# Patient Record
Sex: Female | Born: 1970 | Race: White | Hispanic: No | Marital: Married | Smoking: Former smoker
Health system: Southern US, Community
[De-identification: ages and names within clinical notes are randomized; demographics above are authoritative.]

## PROBLEM LIST (undated history)

## (undated) DIAGNOSIS — F419 Anxiety disorder, unspecified: Secondary | ICD-10-CM

## (undated) DIAGNOSIS — D649 Anemia, unspecified: Secondary | ICD-10-CM

---

## 2000-01-26 ENCOUNTER — Emergency Department (HOSPITAL_COMMUNITY): Admission: EM | Admit: 2000-01-26 | Discharge: 2000-01-26 | Payer: Self-pay | Admitting: Emergency Medicine

## 2000-02-07 ENCOUNTER — Emergency Department (HOSPITAL_COMMUNITY): Admission: EM | Admit: 2000-02-07 | Discharge: 2000-02-07 | Payer: Self-pay

## 2000-02-23 ENCOUNTER — Emergency Department (HOSPITAL_COMMUNITY): Admission: EM | Admit: 2000-02-23 | Discharge: 2000-02-23 | Payer: Self-pay | Admitting: *Deleted

## 2007-01-07 ENCOUNTER — Other Ambulatory Visit: Admission: RE | Admit: 2007-01-07 | Discharge: 2007-01-07 | Payer: Self-pay | Admitting: Obstetrics and Gynecology

## 2007-01-08 ENCOUNTER — Ambulatory Visit (HOSPITAL_COMMUNITY): Admission: RE | Admit: 2007-01-08 | Discharge: 2007-01-08 | Payer: Self-pay | Admitting: Obstetrics and Gynecology

## 2007-01-29 ENCOUNTER — Ambulatory Visit (HOSPITAL_COMMUNITY): Admission: RE | Admit: 2007-01-29 | Discharge: 2007-01-29 | Payer: Self-pay | Admitting: Obstetrics and Gynecology

## 2007-07-22 ENCOUNTER — Inpatient Hospital Stay (HOSPITAL_COMMUNITY): Admission: AD | Admit: 2007-07-22 | Discharge: 2007-07-22 | Payer: Self-pay | Admitting: Obstetrics & Gynecology

## 2007-08-13 ENCOUNTER — Inpatient Hospital Stay (HOSPITAL_COMMUNITY): Admission: AD | Admit: 2007-08-13 | Discharge: 2007-08-15 | Payer: Self-pay | Admitting: Obstetrics and Gynecology

## 2007-08-23 ENCOUNTER — Inpatient Hospital Stay (HOSPITAL_COMMUNITY): Admission: AD | Admit: 2007-08-23 | Discharge: 2007-08-23 | Payer: Self-pay | Admitting: Obstetrics and Gynecology

## 2007-09-24 ENCOUNTER — Inpatient Hospital Stay (HOSPITAL_COMMUNITY): Admission: AD | Admit: 2007-09-24 | Discharge: 2007-09-24 | Payer: Self-pay | Admitting: Obstetrics and Gynecology

## 2008-07-07 ENCOUNTER — Observation Stay: Payer: Self-pay | Admitting: Obstetrics and Gynecology

## 2008-09-29 ENCOUNTER — Inpatient Hospital Stay (HOSPITAL_COMMUNITY): Admission: AD | Admit: 2008-09-29 | Discharge: 2008-09-29 | Payer: Self-pay | Admitting: Obstetrics and Gynecology

## 2008-10-11 ENCOUNTER — Inpatient Hospital Stay (HOSPITAL_COMMUNITY): Admission: AD | Admit: 2008-10-11 | Discharge: 2008-10-14 | Payer: Self-pay | Admitting: Obstetrics & Gynecology

## 2010-06-25 ENCOUNTER — Encounter: Payer: Self-pay | Admitting: Obstetrics and Gynecology

## 2010-09-12 LAB — CBC
HCT: 30.8 % — ABNORMAL LOW (ref 36.0–46.0)
HCT: 32.2 % — ABNORMAL LOW (ref 36.0–46.0)
Hemoglobin: 10.2 g/dL — ABNORMAL LOW (ref 12.0–15.0)
MCHC: 32.8 g/dL (ref 30.0–36.0)
MCHC: 33.2 g/dL (ref 30.0–36.0)
MCV: 83 fL (ref 78.0–100.0)
MCV: 85.1 fL (ref 78.0–100.0)
Platelets: 159 10*3/uL (ref 150–400)
RDW: 16.8 % — ABNORMAL HIGH (ref 11.5–15.5)
RDW: 16.9 % — ABNORMAL HIGH (ref 11.5–15.5)

## 2010-12-06 ENCOUNTER — Emergency Department: Payer: Self-pay | Admitting: Internal Medicine

## 2011-02-26 LAB — DIFFERENTIAL
Basophils Absolute: 0
Eosinophils Absolute: 0.2
Lymphocytes Relative: 15
Lymphs Abs: 1.3
Neutrophils Relative %: 76

## 2011-02-26 LAB — CBC
MCHC: 33.5
MCV: 81
Platelets: 210
Platelets: 240
Platelets: 246
RDW: 16.3 — ABNORMAL HIGH
RDW: 16.7 — ABNORMAL HIGH
RDW: 16.6 — ABNORMAL HIGH
WBC: 11.6 — ABNORMAL HIGH
WBC: 8.4

## 2011-02-26 LAB — URINE MICROSCOPIC-ADD ON: RBC / HPF: NONE SEEN

## 2011-02-26 LAB — URINALYSIS, ROUTINE W REFLEX MICROSCOPIC
Leukocytes, UA: NEGATIVE
Nitrite: NEGATIVE
Specific Gravity, Urine: <1.005 — ABNORMAL LOW
Urobilinogen, UA: 0.2
pH: 5.5

## 2011-02-26 LAB — RPR: RPR Ser Ql: NONREACTIVE

## 2016-10-23 ENCOUNTER — Emergency Department
Admission: EM | Admit: 2016-10-23 | Discharge: 2016-10-23 | Disposition: A | Discharge status: Home / Self Care | Payer: BLUE CROSS/BLUE SHIELD | Attending: Emergency Medicine | Admitting: Emergency Medicine

## 2016-10-23 ENCOUNTER — Encounter: Payer: Self-pay | Admitting: Emergency Medicine

## 2016-10-23 DIAGNOSIS — Z87891 Personal history of nicotine dependence: Secondary | ICD-10-CM | POA: Insufficient documentation

## 2016-10-23 DIAGNOSIS — R002 Palpitations: Secondary | ICD-10-CM | POA: Diagnosis present

## 2016-10-23 DIAGNOSIS — D509 Iron deficiency anemia, unspecified: Secondary | ICD-10-CM | POA: Diagnosis not present

## 2016-10-23 LAB — CBC
HCT: 32.3 % — ABNORMAL LOW (ref 35.0–47.0)
HEMOGLOBIN: 10.2 g/dL — AB (ref 12.0–16.0)
MCH: 22.9 pg — ABNORMAL LOW (ref 26.0–34.0)
MCHC: 31.7 g/dL — AB (ref 32.0–36.0)
MCV: 72.1 fL — AB (ref 80.0–100.0)
PLATELETS: 308 10*3/uL (ref 150–440)
RBC: 4.48 MIL/uL (ref 3.80–5.20)
RDW: 17.8 % — ABNORMAL HIGH (ref 11.5–14.5)
WBC: 8.5 10*3/uL (ref 3.6–11.0)

## 2016-10-23 LAB — COMPREHENSIVE METABOLIC PANEL
ALK PHOS: 42 U/L (ref 38–126)
ALT: 9 U/L — AB (ref 14–54)
AST: 23 U/L (ref 15–41)
Albumin: 4.2 g/dL (ref 3.5–5.0)
Anion gap: 9 (ref 5–15)
BUN: 10 mg/dL (ref 6–20)
CHLORIDE: 103 mmol/L (ref 101–111)
CO2: 27 mmol/L (ref 22–32)
Calcium: 9.6 mg/dL (ref 8.9–10.3)
Creatinine, Ser: 0.71 mg/dL (ref 0.44–1.00)
GFR calc Af Amer: >60 mL/min (ref >60)
GFR calc non Af Amer: >60 mL/min (ref >60)
GLUCOSE: 109 mg/dL — AB (ref 65–99)
POTASSIUM: 3.3 mmol/L — AB (ref 3.5–5.1)
Sodium: 139 mmol/L (ref 135–145)
Total Bilirubin: 0.6 mg/dL (ref 0.3–1.2)
Total Protein: 7.8 g/dL (ref 6.5–8.1)

## 2016-10-23 LAB — T4, FREE: Free T4: 0.9 ng/dL (ref 0.61–1.12)

## 2016-10-23 LAB — TSH: TSH: 4.09 u[IU]/mL (ref 0.350–4.500)

## 2016-10-23 LAB — TROPONIN I: Troponin I: <0.03 ng/mL (ref <0.03)

## 2016-10-23 MED ORDER — FERROUS SULFATE DRIED ER 160 (50 FE) MG PO TBCR: 160.0000 mg | EXTENDED_RELEASE_TABLET | Freq: Every day | ORAL | 6 refills | Status: AC

## 2016-10-23 MED ORDER — DIAZEPAM 5 MG PO TABS
10.0000 mg | ORAL_TABLET | Freq: Once | ORAL | Status: AC
  Administered 2016-10-23: 10 mg via ORAL
  Filled 2016-10-23: qty 2

## 2016-10-23 MED ORDER — LORAZEPAM 0.5 MG PO TABS: 0.5000 mg | ORAL_TABLET | Freq: Three times a day (TID) | ORAL | 0 refills | Status: AC | PRN

## 2016-10-23 NOTE — ED Notes (Addendum)
Pt reports that for the last week she has been having an irregular heartbeat with episodes of a rapid heart rate and feeling like she was about to pass out - she states it started out as once a week but now is happening multiple times a day - denies N/V - denies headache - denies any cardiac history - at this time HR 74 NS - Pt reports that she is anxious and that is causing her to feel pressure in her chest for the last few days

## 2016-10-23 NOTE — ED Triage Notes (Signed)
Pt states she has been having episodes that she feels her heart has missed a beat, and occasional episodes of her heart beating fast. Rate 112 at this time and regular, states her hands get cold and tingle during these episodes. NAD, appears anxious.

## 2016-10-23 NOTE — ED Provider Notes (Signed)
[facility] Emergency Department Provider Note       Time seen: ----------------------------------------- 1:11 PM on 10/23/2016 -----------------------------------------     I have reviewed the triage vital signs and the nursing notes.   HISTORY   Chief Complaint Palpitations    HPI Stacy Haynes is a 46 y.o. female who presents to the ED for palpitations. Patient states she's having episodes where it feels like her heart is missing a beat and occasionally is racing. Patient states she's not sure if it's anxiety related or not. She denies any recent illness, denies fevers, chills, chest pain, shortness of breath, vomiting or diarrhea. This appears to occur sporadically.   History reviewed. No pertinent past medical history.  There are no active problems to display for this patient.   History reviewed. No pertinent surgical history.  Allergies Patient has no known allergies.  Social History Social History  Substance Use Topics  . Smoking status: Former Games developer  . Smokeless tobacco: Never Used  . Alcohol use No    Review of Systems Constitutional: Negative for fever. Eyes: Negative for vision changes ENT:  Negative for congestion, sore throat Cardiovascular: Negative for chest pain.Positive for palpitations Respiratory: Negative for shortness of breath. Gastrointestinal: Negative for abdominal pain, vomiting and diarrhea. Genitourinary: Negative for dysuria. Musculoskeletal: Negative for back pain. Skin: Negative for rash. Neurological: Negative for headaches, focal weakness or numbness.  All systems negative/normal/unremarkable except as stated in the HPI  ____________________________________________   PHYSICAL EXAM:  VITAL SIGNS: ED Triage Vitals  Enc Vitals Group     BP 10/23/16 1104 118/76     Pulse Rate 10/23/16 1104 (!) 112     Resp 10/23/16 1104 18     Temp 10/23/16 1104 98 F (36.7 C)     Temp Source 10/23/16 1104  Oral     SpO2 10/23/16 1104 100 %     Weight 10/23/16 1105 145 lb (65.8 kg)     Height 10/23/16 1105 5\' 6"  (1.676 m)     Head Circumference --      Peak Flow --      Pain Score --      Pain Loc --      Pain Edu? --      Excl. in GC? --     Constitutional: Alert and oriented. Well appearing and in no distress. Eyes: Conjunctivae are normal. Normal extraocular movements. ENT   Head: Normocephalic and atraumatic.   Nose: No congestion/rhinnorhea.   Mouth/Throat: Mucous membranes are moist.   Neck: No stridor. Cardiovascular: Normal rate, regular rhythm. No murmurs, rubs, or gallops. Respiratory: Normal respiratory effort without tachypnea nor retractions. Breath sounds are clear and equal bilaterally. No wheezes/rales/rhonchi. Gastrointestinal: Soft and nontender. Normal bowel sounds Musculoskeletal: Nontender with normal range of motion in extremities. No lower extremity tenderness nor edema. Neurologic:  Normal speech and language. No gross focal neurologic deficits are appreciated.  Skin:  Skin is warm, dry and intact. No rash noted. Psychiatric: Mood and affect are normal. Speech and behavior are normal.  ____________________________________________  EKG: Interpreted by me. Sinus rhythm with rate 88 bpm, normal PR interval, normal QRS, normal QT.  ____________________________________________  ED COURSE:  Pertinent labs & imaging results that were available during my care of the patient were reviewed by me and considered in my medical decision making (see chart for details). Patient presents for palpitations, we will assess with labs and imaging as indicated.   Procedures ____________________________________________   LABS (pertinent positives/negatives)  Labs Reviewed  CBC - Abnormal; Notable for the following:       Result Value   Hemoglobin 10.2 (*)    HCT 32.3 (*)    MCV 72.1 (*)    MCH 22.9 (*)    MCHC 31.7 (*)    RDW 17.8 (*)    All other  components within normal limits  COMPREHENSIVE METABOLIC PANEL - Abnormal; Notable for the following:    Potassium 3.3 (*)    Glucose, Bld 109 (*)    ALT 9 (*)    All other components within normal limits  TSH  T4, FREE  TROPONIN I   ____________________________________________  FINAL ASSESSMENT AND PLAN  Palpitations, Iron deficiency anemia  Plan: Patient's labs and imaging were dictated above. Patient had presented for palpitations of uncertain etiology. She'll be referred to cardiology for outpatient follow-up. I have placed her on iron and will prescribe anxiety medicine to try when symptoms recur. She is stable for outpatient follow-up.   Emily FilbertWilliams, Salwa Bai E, MD   Note: This note was generated in part or whole with voice recognition software. Voice recognition is usually quite accurate but there are transcription errors that can and very often do occur. I apologize for any typographical errors that were not detected and corrected.     Emily FilbertWilliams, Gelisa Tieken E, MD 10/23/16 [phone]

## 2016-10-24 ENCOUNTER — Telehealth: Payer: Self-pay

## 2016-10-24 NOTE — Telephone Encounter (Signed)
Lmov for patient to call back she was in ED on 10/24/16 for palpitations Will try again at a later time

## 2016-10-26 NOTE — Telephone Encounter (Signed)
Pt scheduled for 11/01/16 with Dr Kirke CorinArida

## 2016-11-01 ENCOUNTER — Encounter: Payer: Self-pay | Admitting: Cardiovascular Disease

## 2016-11-01 ENCOUNTER — Ambulatory Visit: Payer: BLUE CROSS/BLUE SHIELD | Admitting: Cardiovascular Disease

## 2020-03-10 ENCOUNTER — Emergency Department
Admission: EM | Admit: 2020-03-10 | Discharge: 2020-03-10 | Disposition: A | Discharge status: Home / Self Care | Payer: BC Managed Care – PPO | Attending: Emergency Medicine | Admitting: Emergency Medicine

## 2020-03-10 ENCOUNTER — Other Ambulatory Visit: Payer: Self-pay

## 2020-03-10 DIAGNOSIS — R202 Paresthesia of skin: Secondary | ICD-10-CM | POA: Insufficient documentation

## 2020-03-10 DIAGNOSIS — Z87891 Personal history of nicotine dependence: Secondary | ICD-10-CM | POA: Insufficient documentation

## 2020-03-10 DIAGNOSIS — F419 Anxiety disorder, unspecified: Secondary | ICD-10-CM | POA: Diagnosis not present

## 2020-03-10 HISTORY — DX: Anemia, unspecified: D64.9

## 2020-03-10 HISTORY — DX: Anxiety disorder, unspecified: F41.9

## 2020-03-10 LAB — GLUCOSE, CAPILLARY: Glucose-Capillary: 83 mg/dL (ref 70–99)

## 2020-03-10 LAB — CBC
HCT: 44.2 % (ref 36.0–46.0)
Hemoglobin: 14.7 g/dL (ref 12.0–15.0)
MCH: 29.5 pg (ref 26.0–34.0)
MCHC: 33.3 g/dL (ref 30.0–36.0)
MCV: 88.6 fL (ref 80.0–100.0)
Platelets: 246 10*3/uL (ref 150–400)
RBC: 4.99 MIL/uL (ref 3.87–5.11)
RDW: 12.9 % (ref 11.5–15.5)
WBC: 6.9 10*3/uL (ref 4.0–10.5)
nRBC: 0 % (ref 0.0–0.2)

## 2020-03-10 LAB — BASIC METABOLIC PANEL
Anion gap: 12 (ref 5–15)
BUN: 14 mg/dL (ref 6–20)
CO2: 24 mmol/L (ref 22–32)
Calcium: 9.4 mg/dL (ref 8.9–10.3)
Chloride: 103 mmol/L (ref 98–111)
Creatinine, Ser: 0.91 mg/dL (ref 0.44–1.00)
GFR calc non Af Amer: >60 mL/min (ref >60)
Glucose, Bld: 92 mg/dL (ref 70–99)
Potassium: 3.8 mmol/L (ref 3.5–5.1)
Sodium: 139 mmol/L (ref 135–145)

## 2020-03-10 LAB — TSH: TSH: 2.728 u[IU]/mL (ref 0.350–4.500)

## 2020-03-10 LAB — URINALYSIS, COMPLETE (UACMP) WITH MICROSCOPIC
Bilirubin Urine: NEGATIVE
Glucose, UA: NEGATIVE mg/dL
Hgb urine dipstick: NEGATIVE
Ketones, ur: NEGATIVE mg/dL
Nitrite: NEGATIVE
Protein, ur: NEGATIVE mg/dL
Specific Gravity, Urine: 1.003 — ABNORMAL LOW (ref 1.005–1.030)
pH: 6 (ref 5.0–8.0)

## 2020-03-10 LAB — POCT PREGNANCY, URINE: Preg Test, Ur: NEGATIVE

## 2020-03-10 MED ORDER — LORAZEPAM 0.5 MG PO TABS: 0.5000 mg | ORAL_TABLET | Freq: Two times a day (BID) | ORAL | 0 refills | Status: AC | PRN

## 2020-03-10 NOTE — ED Provider Notes (Signed)
[facility] Emergency Department Provider Note  ____________________________________________   First MD Initiated Contact with Patient 03/10/20 1548     (approximate)  I have reviewed the triage vital signs and the nursing notes.   HISTORY  Chief Complaint Numbness    HPI Stacy Haynes is a 49 y.o. female with history of anxiety, anemia, here with increasingly frequent episodes of hot flashes, tingling in her hands and feet, and anxiety.  Patient states that for the last week or so, she has had increasingly frequent episodes in which she feels flushed, fatigued, drained, and anxious.  She has tingling and numbness in her hands and feet, and occasionally around her mouth.  These episodes have been increasingly frequent and severe.  She is having difficulty sleeping.  She states this is now interfering with her ability to live her life.  Denies specific recent triggers or stressors.  Denies any stimulant, caffeine, or over-the-counter medication use.  She has never required medication for anxiety.  Of note, she does note that she has began having irregular periods and believes that some of her hot flashes are related to this.  No known history of thyroid disorder.          Past Medical History:  Diagnosis Date  . Anemia   . Anxiety     There are no problems to display for this patient.   History reviewed. No pertinent surgical history.  Prior to Admission medications   Medication Sig Start Date End Date Taking? Authorizing Provider  ferrous sulfate (EQL SLOW RELEASE IRON) 160 (50 Fe) MG TBCR SR tablet Take 1 tablet (160 mg total) by mouth daily. 10/23/16   Emily Filbert, MD  LORazepam (ATIVAN) 0.5 MG tablet Take 1 tablet (0.5 mg total) by mouth 2 (two) times daily as needed for anxiety or sleep. 03/10/20 03/10/21  Shaune Pollack, MD    Allergies Patient has no known allergies.  No family history on file.  Social History Social History    Tobacco Use  . Smoking status: Former Games developer  . Smokeless tobacco: Never Used  Substance Use Topics  . Alcohol use: No  . Drug use: Never    Review of Systems  Review of Systems  Constitutional: Positive for fatigue. Negative for fever.  HENT: Negative for congestion and sore throat.   Eyes: Negative for visual disturbance.  Respiratory: Negative for cough and shortness of breath.   Cardiovascular: Negative for chest pain.  Gastrointestinal: Negative for abdominal pain, diarrhea, nausea and vomiting.  Genitourinary: Negative for flank pain.  Musculoskeletal: Negative for back pain and neck pain.  Skin: Negative for rash and wound.  Neurological: Positive for numbness. Negative for weakness.  Psychiatric/Behavioral: The patient is nervous/anxious.   All other systems reviewed and are negative.    ____________________________________________  PHYSICAL EXAM:      VITAL SIGNS: ED Triage Vitals  Enc Vitals Group     BP 03/10/20 1219 119/79     Pulse Rate 03/10/20 1219 77     Resp 03/10/20 1219 18     Temp 03/10/20 1219 98.7 F (37.1 C)     Temp Source 03/10/20 1219 Oral     SpO2 03/10/20 1219 99 %     Weight 03/10/20 1211 160 lb (72.6 kg)     Height 03/10/20 1211 5\' 6"  (1.676 m)     Head Circumference --      Peak Flow --      Pain Score --  Pain Loc --      Pain Edu? --      Excl. in GC? --      Physical Exam Vitals and nursing note reviewed.  Constitutional:      General: She is not in acute distress.    Appearance: She is well-developed.  HENT:     Head: Normocephalic and atraumatic.  Eyes:     Conjunctiva/sclera: Conjunctivae normal.  Cardiovascular:     Rate and Rhythm: Normal rate and regular rhythm.     Heart sounds: Normal heart sounds. No murmur heard.  No friction rub.  Pulmonary:     Effort: Pulmonary effort is normal. No respiratory distress.     Breath sounds: Normal breath sounds. No wheezing or rales.  Abdominal:     General: There  is no distension.     Palpations: Abdomen is soft.     Tenderness: There is no abdominal tenderness.  Musculoskeletal:     Cervical back: Neck supple.  Skin:    General: Skin is warm.     Capillary Refill: Capillary refill takes less than 2 seconds.  Neurological:     Mental Status: She is alert and oriented to person, place, and time.     Motor: No abnormal muscle tone.  Psychiatric:        Mood and Affect: Mood is anxious.       ____________________________________________   LABS (all labs ordered are listed, but only abnormal results are displayed)  Labs Reviewed  URINALYSIS, COMPLETE (UACMP) WITH MICROSCOPIC - Abnormal; Notable for the following components:      Result Value   Color, Urine STRAW (*)    APPearance CLEAR (*)    Specific Gravity, Urine 1.003 (*)    Leukocytes,Ua SMALL (*)    Bacteria, UA RARE (*)    All other components within normal limits  BASIC METABOLIC PANEL  CBC  GLUCOSE, CAPILLARY  TSH  TSH  CBG MONITORING, ED  POC URINE PREG, ED  POCT PREGNANCY, URINE    ____________________________________________  EKG: Normal sinus rhythm, ventricular rate 68.  PR 84, QRS 86, QTc 399.  No acute ST elevations or depressions. ________________________________________  RADIOLOGY All imaging, including plain films, CT scans, and ultrasounds, independently reviewed by me, and interpretations confirmed via formal radiology reads.  ED MD interpretation:     Official radiology report(s): No results found.  ____________________________________________  PROCEDURES   Procedure(s) performed (including Critical Care):  Procedures  ____________________________________________  INITIAL IMPRESSION / MDM / ASSESSMENT AND PLAN / ED COURSE  As part of my medical decision making, I reviewed the following data within the electronic MEDICAL RECORD NUMBER Nursing notes reviewed and incorporated, Old chart reviewed, Notes from prior ED visits, and [place] Controlled  Substance Database       *Stacy Haynes was evaluated in Emergency Department on 03/10/2020 for the symptoms described in the history of present illness. She was evaluated in the context of the global COVID-19 pandemic, which necessitated consideration that the patient might be at risk for infection with the SARS-CoV-2 virus that causes COVID-19. Institutional protocols and algorithms that pertain to the evaluation of patients at risk for COVID-19 are in a state of rapid change based on information released by regulatory bodies including the CDC and federal and state organizations. These policies and algorithms were followed during the patient's care in the ED.  Some ED evaluations and interventions may be delayed as a result of limited staffing during the pandemic.*  Medical Decision Making: 49 year old female here with increasingly frequent episodes of hot flashes, numbness, tingling, and anxiety.  Primary suspicion is worsening generalized anxiety with possible panic attacks, possibly precipitated or affected by perimenopausal hormone changes.  Lab work reviewed and unremarkable.  No significant anemia or electrolyte abnormality.  Her vital signs are stable.  EKG is normal with no signs of arrhythmia or ischemia.  No over-the-counter medication or other high risk medication use.  She is having significant difficulty sleeping which I suspect is contributing to her fatigue as well as decompensated anxiety.  She has no history of previous substance abuse.  Given that most of her symptoms are related to anxiety as well as difficulty sleeping, will trial a very low-dose of Ativan at night to help with her sleep.  Will urge outpatient PCP follow-up as well.  ____________________________________________  FINAL CLINICAL IMPRESSION(S) / ED DIAGNOSES  Final diagnoses:  Anxiety  Paresthesias     MEDICATIONS GIVEN DURING THIS VISIT:  Medications - No data to display   ED Discharge Orders          Ordered    LORazepam (ATIVAN) 0.5 MG tablet  2 times daily PRN        03/10/20 1709           Note:  This document was prepared using Dragon voice recognition software and may include unintentional dictation errors.   Shaune Pollack, MD 03/10/20 [phone]

## 2020-03-10 NOTE — ED Notes (Signed)
Lab called for add on TSH

## 2020-03-10 NOTE — Discharge Instructions (Addendum)
Try the low-dose Ativan at night.  You can take 0.5 mg twice a day as needed, starting at night.  If this does not produce sleep, you can increase to 1 mg at night daily.  Call your PCP.  Where your smart watch to monitor your heart rate during the episodes, to see if it is increasing above 120s.  If so, call your doctor or return to the ER.

## 2020-03-10 NOTE — ED Triage Notes (Signed)
Pt to the er for tingling and numbness x 4 or 5 days. Pt states she is weak and has now lost her appetite. Pt denies vomiting diarrhea, or SOB. Pt states the sensation comes and goes.

## 2020-03-15 ENCOUNTER — Other Ambulatory Visit: Payer: Self-pay | Admitting: Internal Medicine

## 2020-03-15 DIAGNOSIS — Z1231 Encounter for screening mammogram for malignant neoplasm of breast: Secondary | ICD-10-CM

## 2020-03-21 ENCOUNTER — Ambulatory Visit
Admission: RE | Admit: 2020-03-21 | Discharge: 2020-03-21 | Disposition: A | Discharge status: Home / Self Care | Payer: BC Managed Care – PPO | Source: Ambulatory Visit | Attending: Internal Medicine | Admitting: Internal Medicine

## 2020-03-21 ENCOUNTER — Other Ambulatory Visit: Payer: Self-pay

## 2020-03-21 DIAGNOSIS — Z1231 Encounter for screening mammogram for malignant neoplasm of breast: Secondary | ICD-10-CM | POA: Diagnosis present

## 2020-03-28 ENCOUNTER — Other Ambulatory Visit: Payer: Self-pay | Admitting: Internal Medicine

## 2020-03-28 DIAGNOSIS — N6310 Unspecified lump in the right breast, unspecified quadrant: Secondary | ICD-10-CM

## 2020-03-28 DIAGNOSIS — N6320 Unspecified lump in the left breast, unspecified quadrant: Secondary | ICD-10-CM

## 2020-03-28 DIAGNOSIS — R928 Other abnormal and inconclusive findings on diagnostic imaging of breast: Secondary | ICD-10-CM

## 2020-04-01 ENCOUNTER — Ambulatory Visit
Admission: RE | Admit: 2020-04-01 | Discharge: 2020-04-01 | Disposition: A | Discharge status: Home / Self Care | Payer: BC Managed Care – PPO | Source: Ambulatory Visit | Attending: Internal Medicine | Admitting: Internal Medicine

## 2020-04-01 ENCOUNTER — Other Ambulatory Visit: Payer: Self-pay

## 2020-04-01 DIAGNOSIS — R928 Other abnormal and inconclusive findings on diagnostic imaging of breast: Secondary | ICD-10-CM | POA: Insufficient documentation

## 2020-04-01 DIAGNOSIS — N6310 Unspecified lump in the right breast, unspecified quadrant: Secondary | ICD-10-CM

## 2020-04-01 DIAGNOSIS — N6320 Unspecified lump in the left breast, unspecified quadrant: Secondary | ICD-10-CM | POA: Diagnosis present

## 2022-01-03 IMAGING — MG DIGITAL SCREENING BILAT W/ TOMO W/ CAD
8 series · 8 of 24 positions shown · non-contrast
Comparison: None.

CLINICAL DATA: Screening.

EXAM:
DIGITAL SCREENING BILATERAL MAMMOGRAM WITH TOMO AND CAD

[L CC synth-2D]
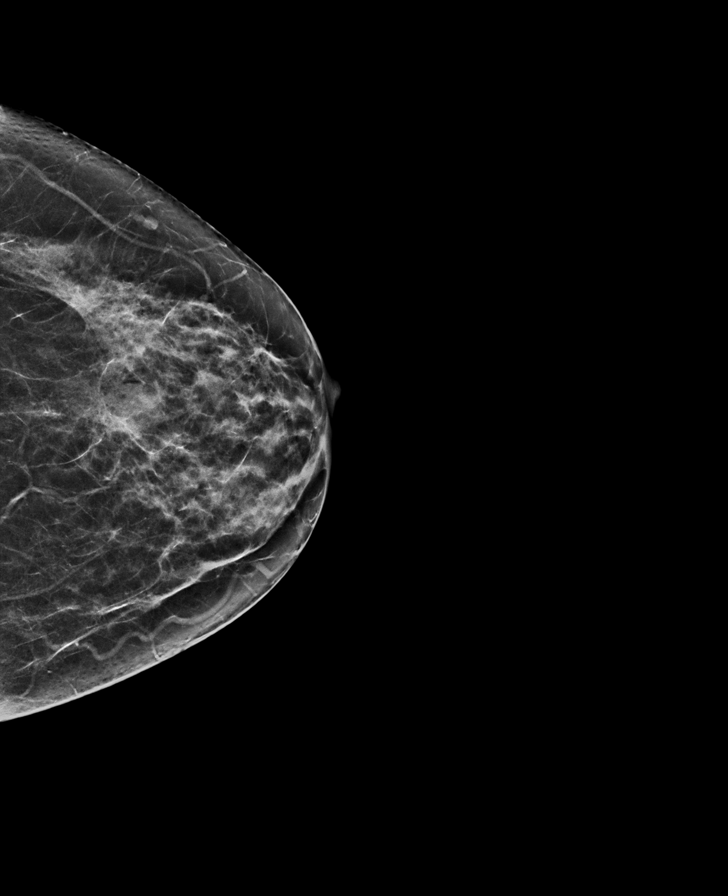

[R CC synth-2D]
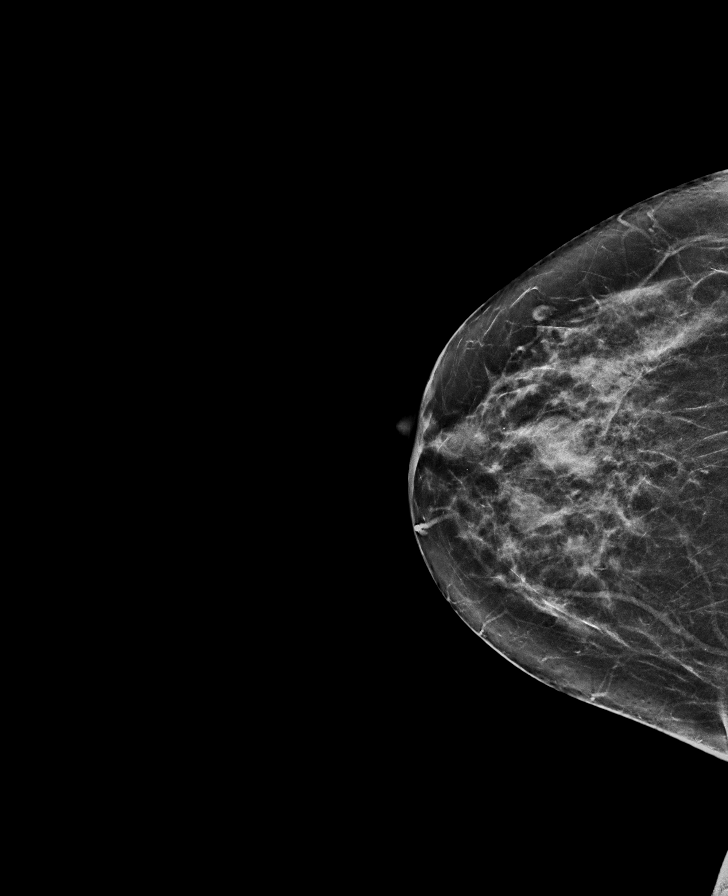

[R MLO synth-2D]
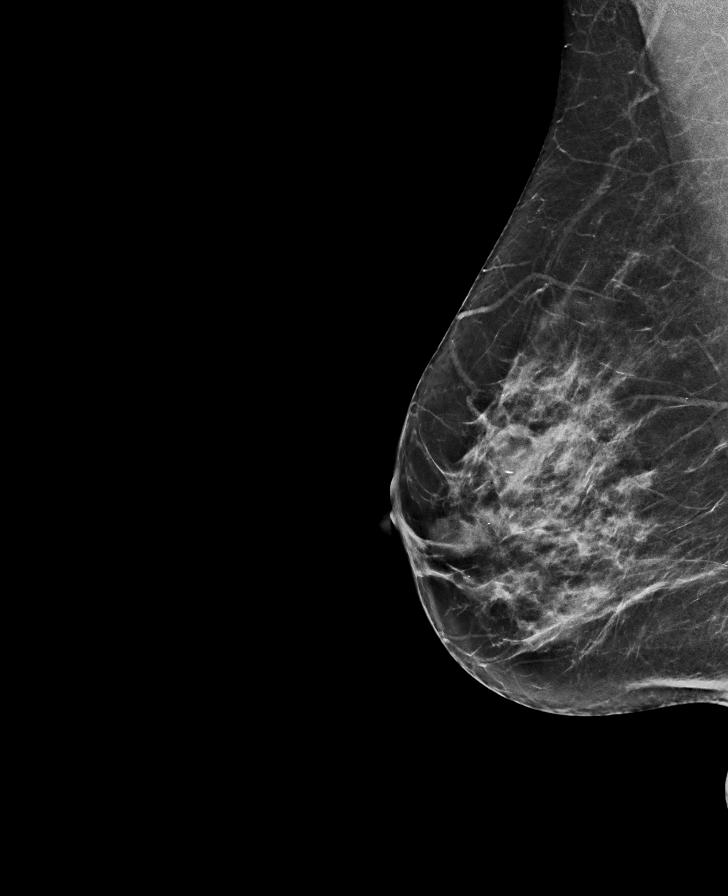

[L MLO synth-2D]
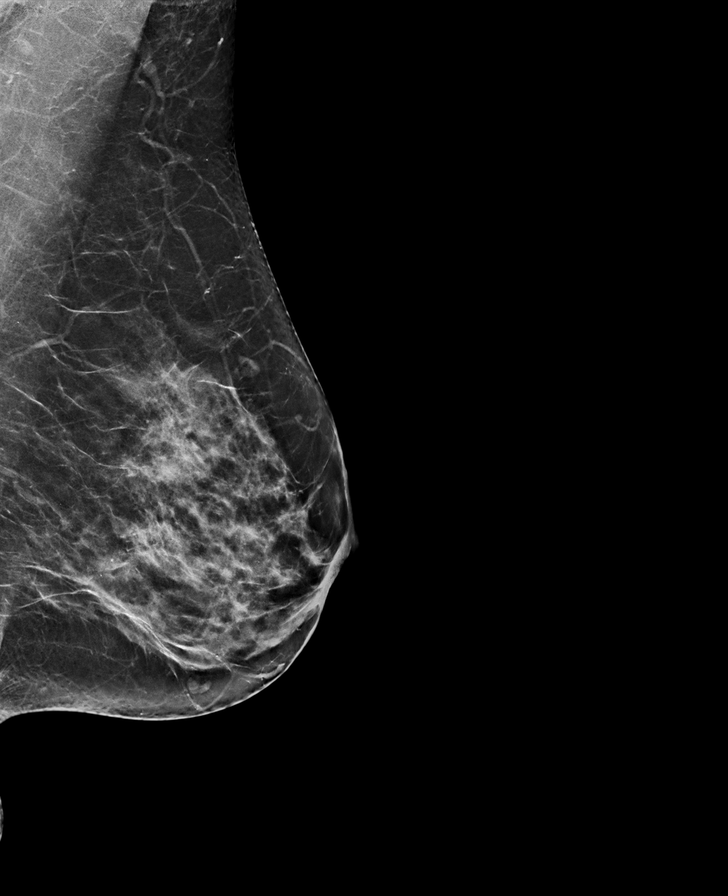

[R CC tomo · tomo slice 33/66.0]
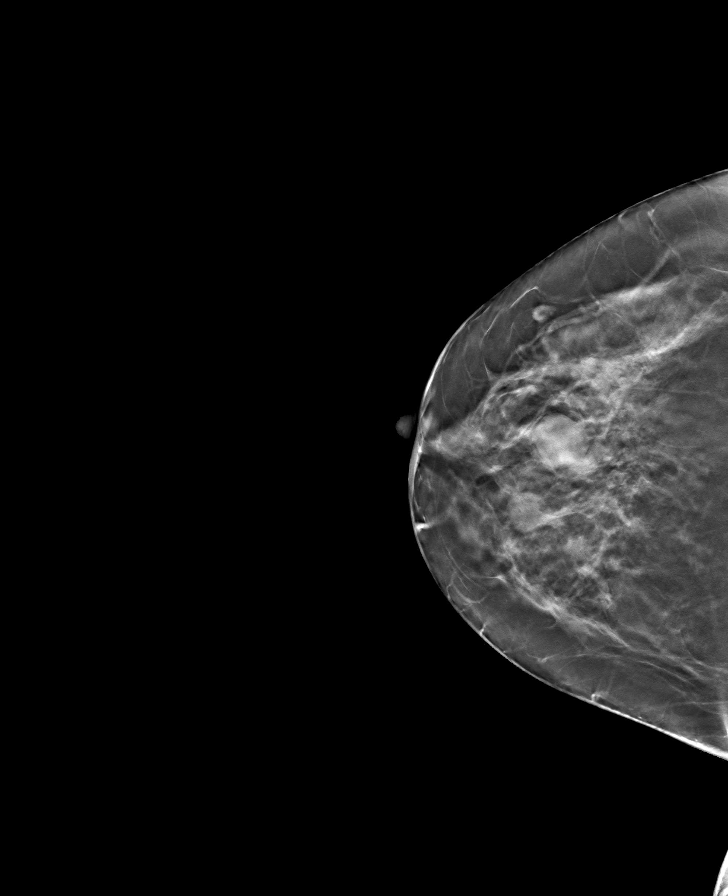

[L CC tomo · tomo slice 31/61.0]
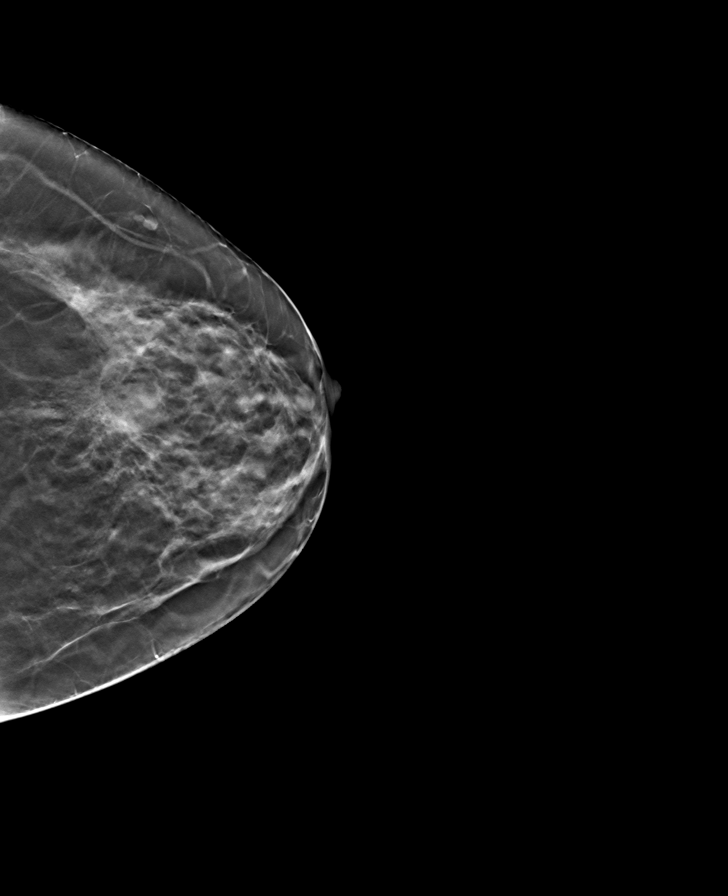

[R MLO tomo · tomo slice 35/69.0]
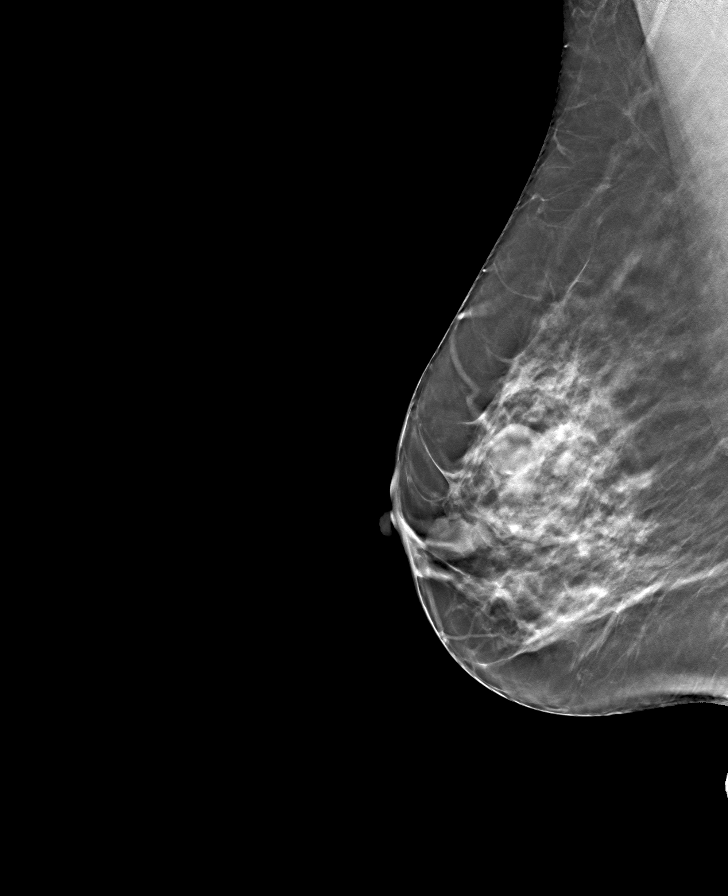

[L MLO tomo · tomo slice 37/72.0]
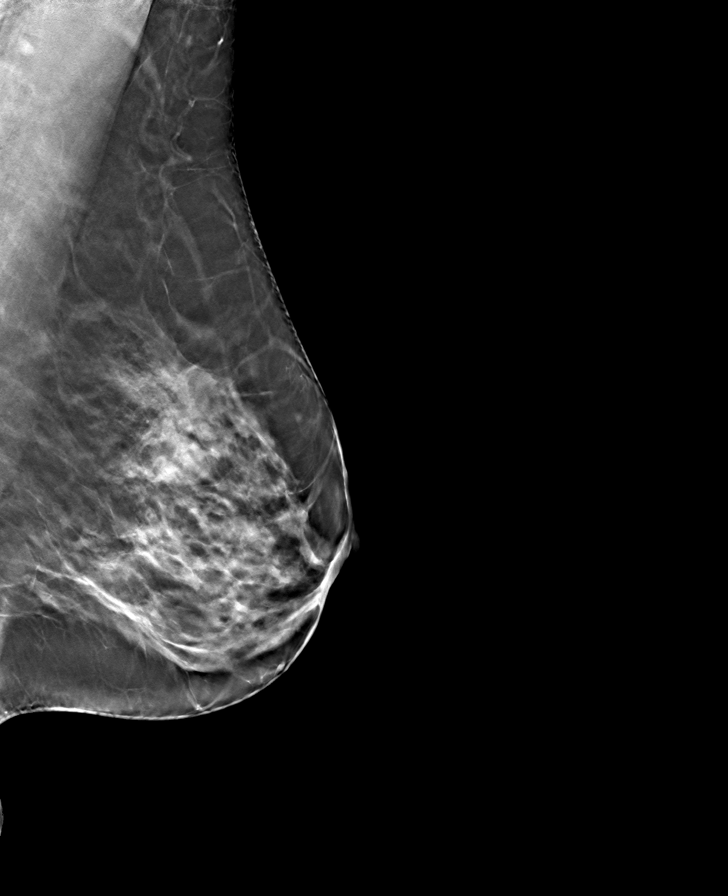

[8 of 24 positions shown; findings below may reference images not displayed]

ACR Breast Density Category c: The breast tissue is heterogeneously
dense, which may obscure small masses.
FINDINGS: In the right breast 2 possible masses require further evaluation.

In the left breast a possible mass requires further evaluation.

Images were processed with CAD.
IMPRESSION: Further evaluation is suggested for possible masses in the right
breast.

Further evaluation is suggested for a possible mass in the left
breast.

RECOMMENDATION:
Diagnostic mammogram and possibly ultrasound of both breasts.
(Code:[id number])

The patient will be contacted regarding the findings, and additional
imaging will be scheduled.

BI-RADS CATEGORY  0: Incomplete. Need additional imaging evaluation
and/or prior mammograms for comparison.

## 2022-01-14 IMAGING — US US BREAST*L* LIMITED INC AXILLA
1 series · 5 of 5 positions shown · non-contrast
Comparison: Screening mammogram dated 03/21/2020.

CLINICAL DATA: Two possible masses in the central right breast,
additional possible mass in the retronipple region of the right
breast and possible mass in the central left breast on a recent
screening mammogram without comparisons.

EXAM:
DIGITAL DIAGNOSTIC BILATERAL MAMMOGRAM WITH TOMO
ULTRASOUND BILATERAL BREAST

[Series 1: us breast*left* limited inc axilla · 0.05mm/px · 5 of 5 slices shown]
[im 1/5]
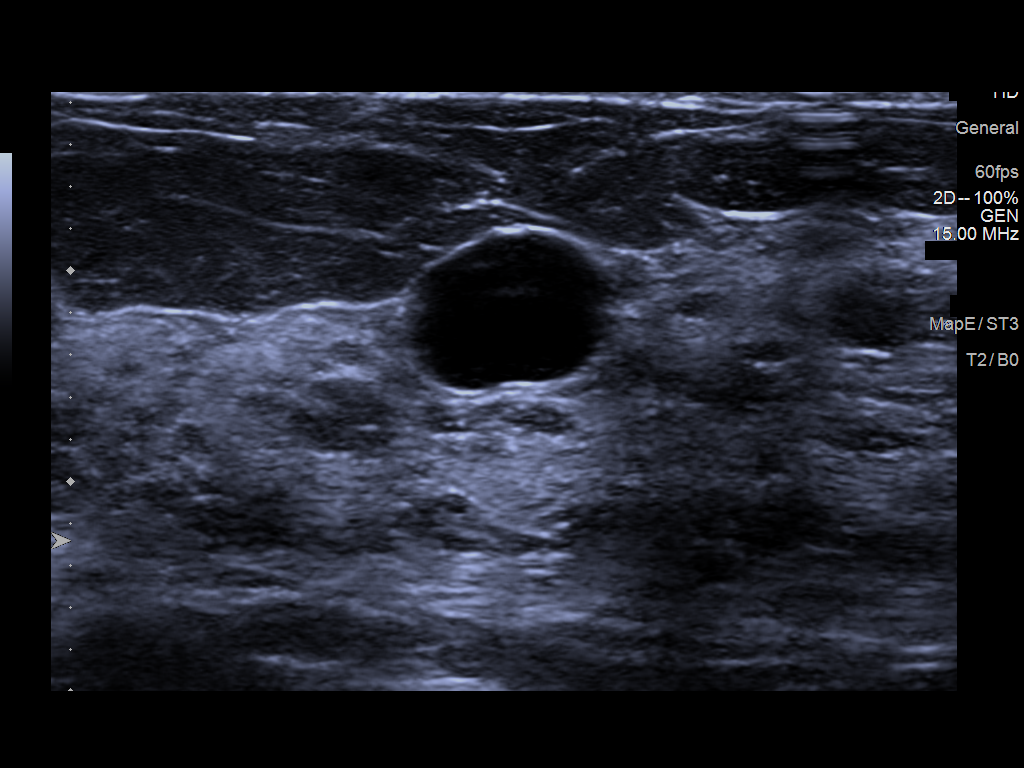
[im 2/5]
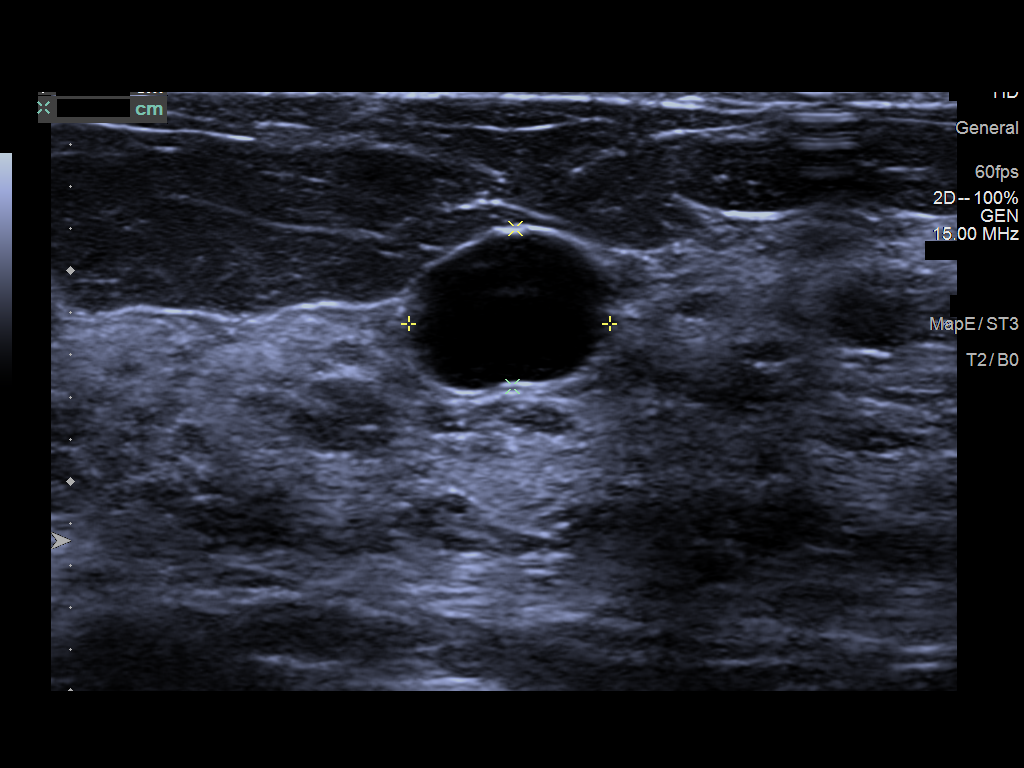
[im 3/5]
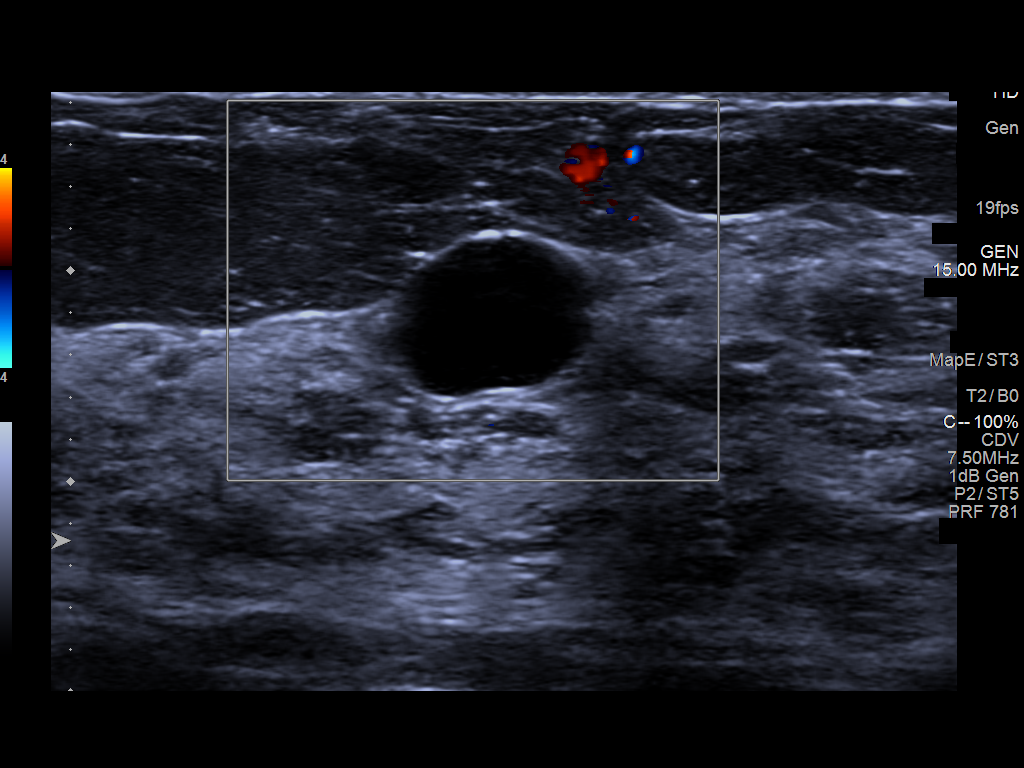
[im 4/5]
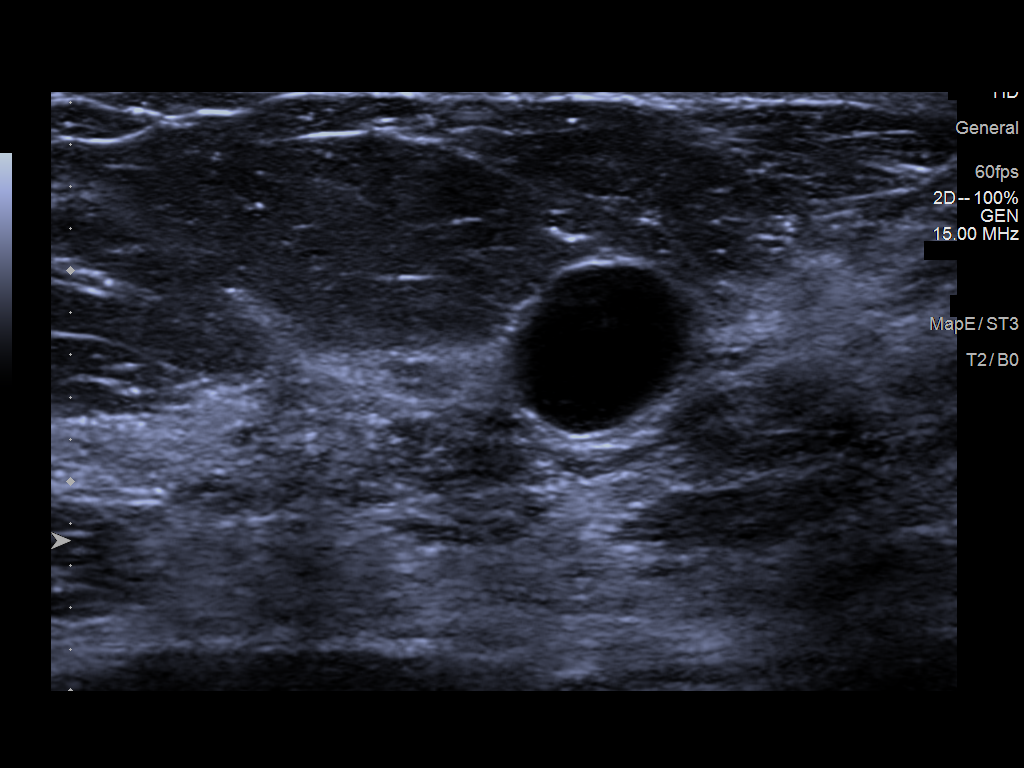
[im 5/5]
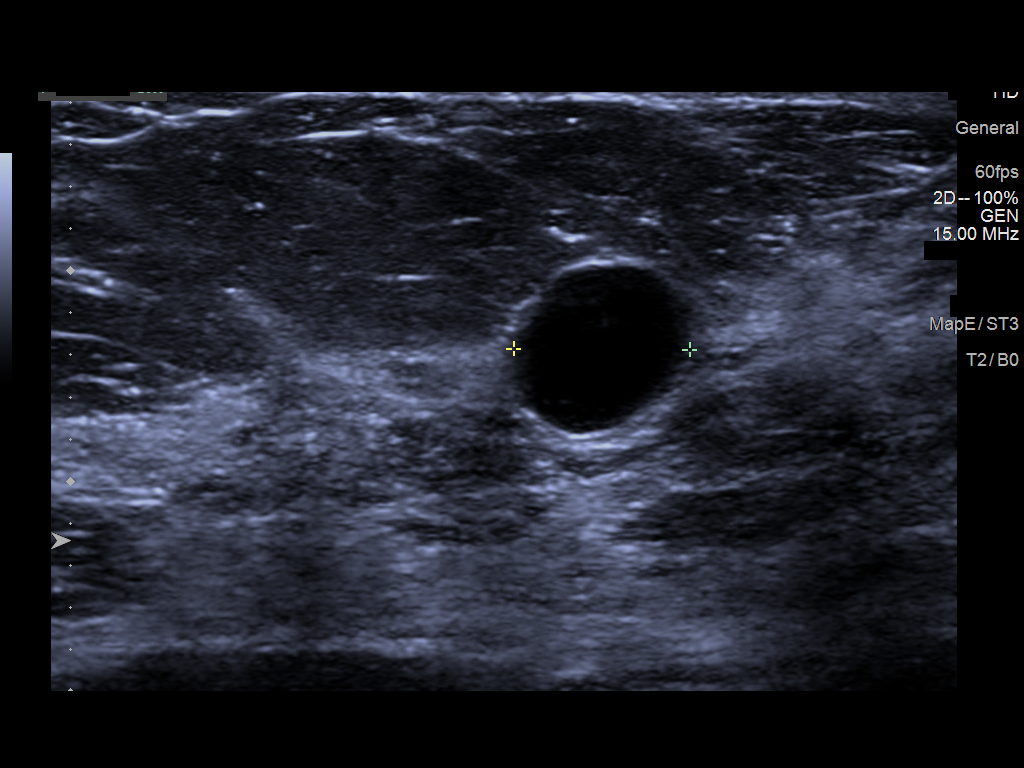

[5 of 5 positions shown; findings below may reference images not displayed]

ACR Breast Density Category c: The breast tissue is heterogeneously
dense, which may obscure small masses.
FINDINGS: 3D tomographic and 2D generated spot compression images of both
breasts were obtained. These confirm multiple rounded and oval,
circumscribed masses in the right breast. The 3 largest include to
in the central portions of the breast and 1 in the retro nipple
region. There is a similar appearing mass in the central left
breast.

Targeted ultrasound is performed, showing a 1.1 cm cyst in the 1
o'clock retroareolar right breast in the middle depth, containing
multiple small foci of mobile, floating internal debris. There is
also a 1.7 cm simple cyst in the 12 o'clock retroareolar right
breast in the middle depth and a 1.5 cm cyst in the retronipple
region containing thin partial internal septations associated with a
small amount of eccentric debris without internal blood flow with
color Doppler.

A 1.0 cm cyst containing minimal internal echoes is demonstrated in
the 12 o'clock retroareolar left breast. No solid masses were seen.
IMPRESSION: Bilateral benign breast cysts.  No evidence of malignancy.

RECOMMENDATION:
Bilateral screening mammogram in 1 year.

I have discussed the findings and recommendations with the patient.
If applicable, a reminder letter will be sent to the patient
regarding the next appointment.

BI-RADS CATEGORY  2: Benign.
